# Patient Record
Sex: Male | Born: 1965 | Race: White | Hispanic: No | Marital: Married | Smoking: Never smoker
Health system: Southern US, Community
[De-identification: ages and names within clinical notes are randomized; demographics above are authoritative.]

## PROBLEM LIST (undated history)

## (undated) DIAGNOSIS — K4020 Bilateral inguinal hernia, without obstruction or gangrene, not specified as recurrent: Secondary | ICD-10-CM

## (undated) DIAGNOSIS — M109 Gout, unspecified: Secondary | ICD-10-CM

## (undated) HISTORY — DX: Bilateral inguinal hernia, without obstruction or gangrene, not specified as recurrent: K40.20

## (undated) HISTORY — DX: Gout, unspecified: M10.9

---

## 1995-01-26 HISTORY — PX: INGUINAL HERNIA REPAIR: SUR1180

## 2001-01-25 HISTORY — PX: INGUINAL HERNIA REPAIR: SUR1180

## 2009-12-29 ENCOUNTER — Emergency Department: Payer: Self-pay | Admitting: Unknown Physician Specialty

## 2011-11-10 ENCOUNTER — Other Ambulatory Visit: Payer: Self-pay | Admitting: Family Medicine

## 2011-11-10 NOTE — Telephone Encounter (Signed)
Patients chart is at the nurses station in the pa pool pile.  [facility] [id number]

## 2011-11-23 IMAGING — US US SOFT TISSUE EXCLUDE HEAD/NECK
1 series · 12 of 12 positions shown · non-contrast
Comparison: none

REASON FOR EXAM: pain right groin right thigh
COMMENTS:

[Series 1: us soft tissue exclude head/neck · 12 of 12 slices shown]
[im 1/12]
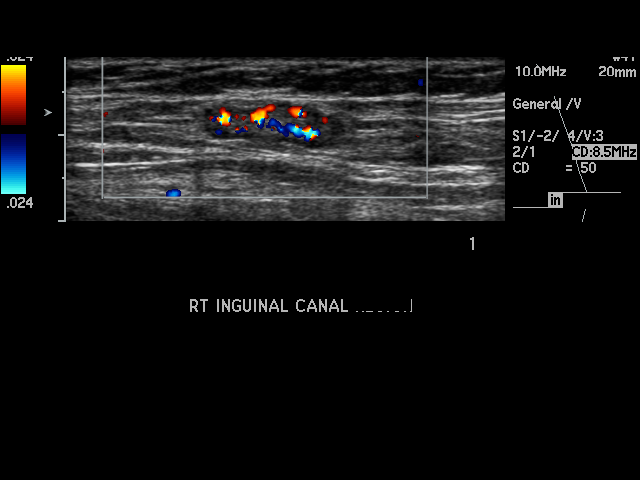
[im 2/12]
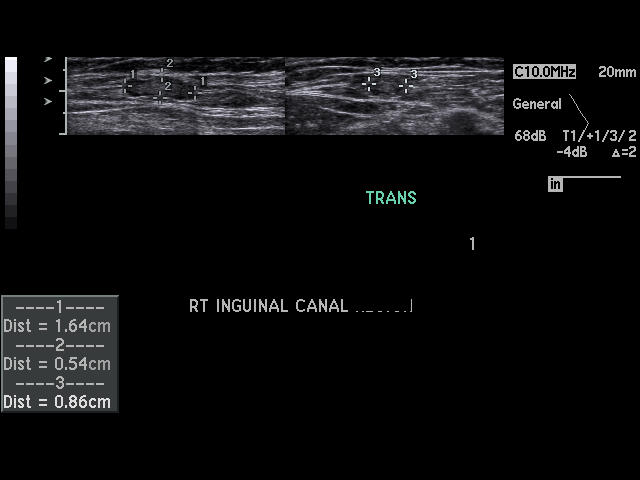
[im 3/12]
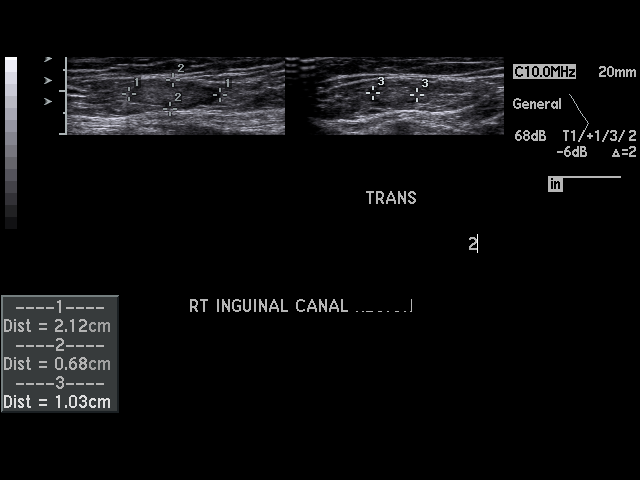
[im 4/12]
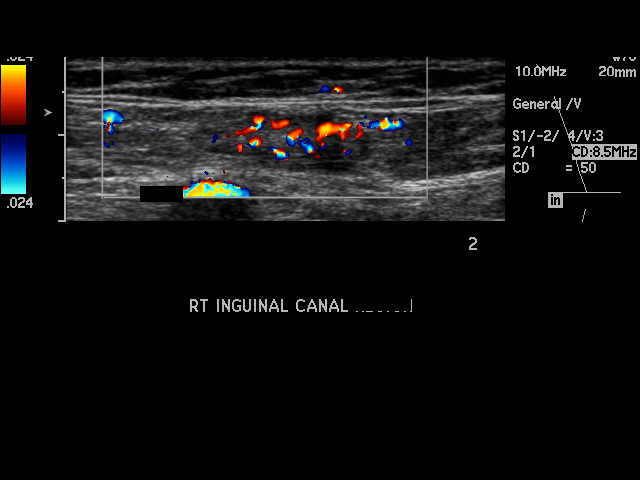
[im 5/12]
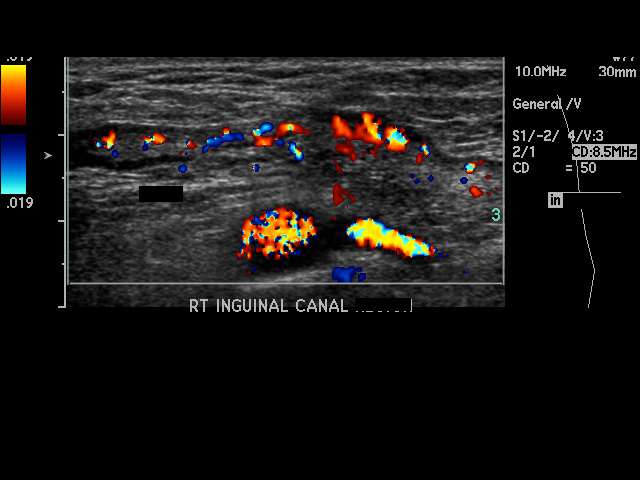
[im 6/12]
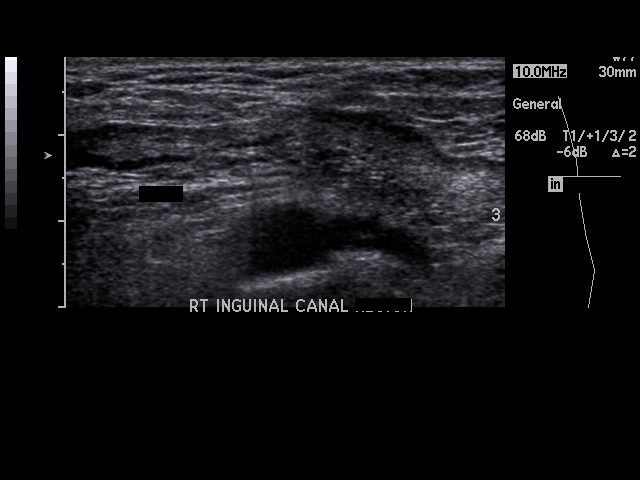
[im 7/12]
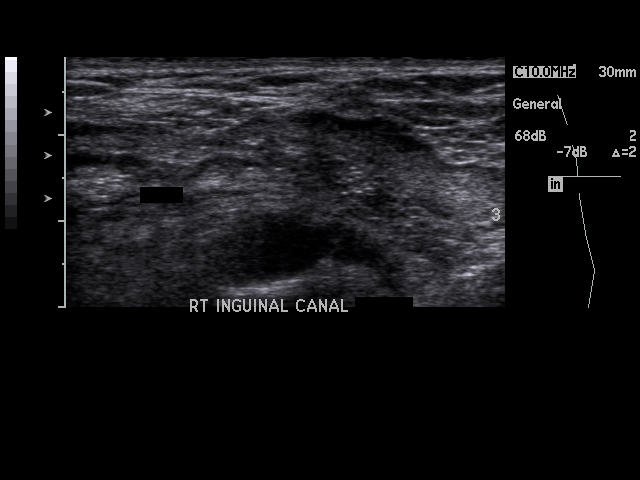
[im 8/12]
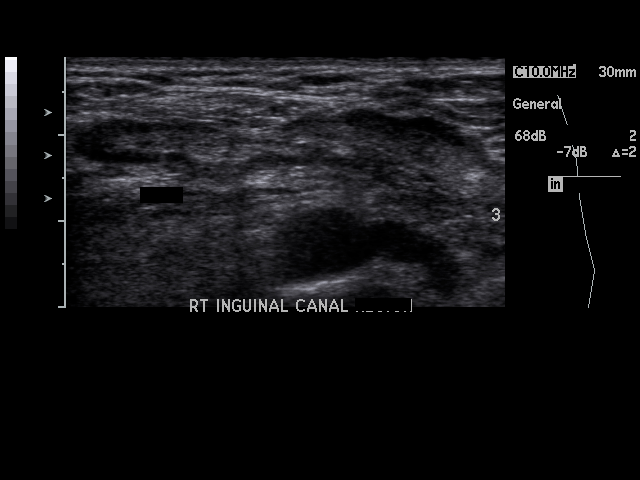
[im 9/12]
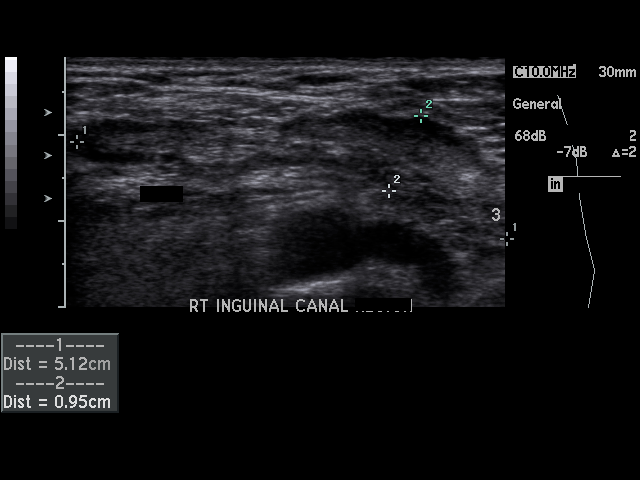
[im 10/12]
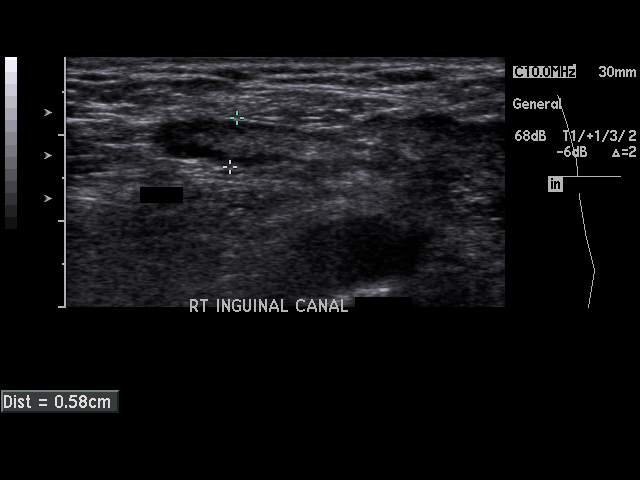
[im 11/12]
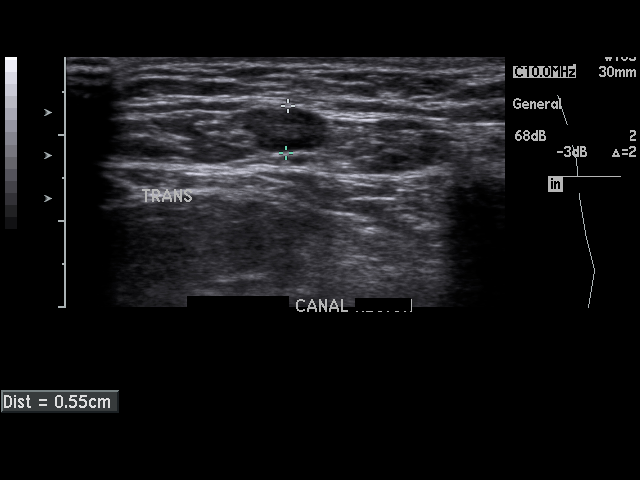
[im 12/12]
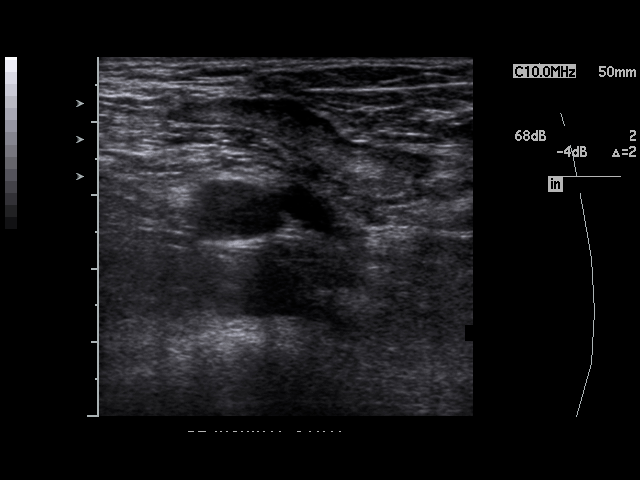

[12 of 12 positions shown; findings below may reference images not displayed]

PROCEDURE:     US  - US SOFT TISSUE, NOT NECK /  HEAD  - December 29, 2009  [DATE]

RESULT:     Targeted ultrasound in the right groin region demonstrates an
area of soft tissue echotexture measuring 5.12 x 0.95 x 0.58 cm in the
region of the inguinal canal. This does not have a fatty appearance
suggestive of a lymph node in the hilar region. The structure appears to be
one of 3 similar appearing areas with the other 2 being less in size with
the first measuring up to 1.64 cm in length and the second measuring up to
2.12 cm in length. The largest is nonspecific. The possibility of a loop of
bowel in the inguinal canal is not completely excluded but no peristalsis
was evident. Some adjacent enlarged lymph node could give this appearance.
No separation is evident. The patient did not seem tender to compression of
the area.
IMPRESSION: Findings suggestive of some possible enlarged lymph nodes
in the right groin region. The possibility of a hernia in the inguinal canal
is not excluded. CT correlation is available if desired. This could be
performed with a CT of the pelvis. Oral contrast would be beneficial if
there is concern for a right inguinal hernia.

## 2017-02-02 ENCOUNTER — Ambulatory Visit: Payer: Self-pay | Admitting: Surgery

## 2017-02-02 ENCOUNTER — Encounter: Payer: Self-pay | Admitting: Surgery

## 2017-02-02 VITALS — BP 170/84 | HR 97 | Temp 98.1°F | Ht 71.0 in | Wt 184.0 lb

## 2017-02-02 DIAGNOSIS — N50819 Testicular pain, unspecified: Secondary | ICD-10-CM

## 2017-02-02 NOTE — Patient Instructions (Signed)
For pain, please take Ibuprofen 600 MG every 8 hours, as need.  Please go to a [facility] location and let them collect your urine. We will call you with results.  [facility] and have your ultrasound done. We will call you back with your results.

## 2017-02-02 NOTE — Progress Notes (Signed)
02/02/2017  Reason for Visit:  Left testicular and groin pain  History of Present Illness: Austin Craig is a 52 y.o. male with a history of bilateral open inguinal hernia repairs in the past who presents with a 2-week history of worsening left testicular and groin pain.  He reports that first 2 weeks ago, he noticed that his left testicle had elevated and with that came some discomfort and pain as well as some scrotal swelling.  This has worsened over the past week and is now also associated with some left groin pain and burning.  He reports that he has some burning and pain sensation with urination as well as with bowel movement as he strains a little bit for them.  Denies any blood in his stool but does report seeing some blood in his underwear that he believes came from his penis, however he denies any hematuria.  He does have unprotected sex but only with 1 partner who is his wife.  He does not have a PCP and has not seen a urologist either.  Past Medical History: Past Medical History:  Diagnosis Date  . Inguinal hernia bilateral, non-recurrent      Past Surgical History: Past Surgical History:  Procedure Laterality Date  . INGUINAL HERNIA REPAIR Right 1997  . INGUINAL HERNIA REPAIR Left 2003    Home Medications: Prior to Admission medications   None    Allergies: No Known Allergies  Social History:  reports that  has never smoked. he has never used smokeless tobacco. He reports that he drinks about 2.4 - 3.6 oz of alcohol per week. He reports that he does not use drugs.   Family History: No family history on file.  Review of Systems: Review of Systems  Constitutional: Negative for chills and fever.  Respiratory: Negative for shortness of breath.   Cardiovascular: Negative for chest pain.  Gastrointestinal: Negative for abdominal pain, blood in stool, constipation, diarrhea, nausea and vomiting.  Genitourinary: Positive for dysuria. Negative for hematuria.  Musculoskeletal:  Positive for myalgias (left groin pain).  Skin: Negative for rash.  Neurological: Negative for dizziness.  Psychiatric/Behavioral: Negative for depression.  All other systems reviewed and are negative.   Physical Exam BP (!) 170/84   Pulse 97   Temp 98.1 F (36.7 C) (Oral)   Ht 5\' 11"  (1.803 m)   Wt 83.5 kg (184 lb)   BMI 25.66 kg/m  CONSTITUTIONAL: No acute distress RESPIRATORY:  Lungs are clear, and breath sounds are equal bilaterally. Normal respiratory effort without pathologic use of accessory muscles. CARDIOVASCULAR: Heart is regular without murmurs, gallops, or rubs. GI: The abdomen is soft, nondistended, with mild tenderness to palpation over the left groin area.  There is mild swelling over the left groin but with no induration.  No evidence of a recurrent hernia on that side. GU: No penile drainage.  Left testicle is raised compared to the right with some tenderness to palpation of both the lower portion upper portion of the testicle.  There is very mild induration of the scrotum.  No significant erythema. NEUROLOGIC:  Motor and sensation is grossly normal.  Cranial nerves are grossly intact. PSYCH:  Alert and oriented to person, place and time. Affect is normal.  Laboratory Analysis: No results found for this or any previous visit (from the past 24 hour(s)).  Imaging: No results found.  Assessment and Plan: This is a 51 y.o. male who presents with left testicular and left groin pain.  Discussed with the patient  at this point I do not have a clear evidence of recurrence of his left inguinal hernia.  There is more concerned given that he has dysuria with pain with urination and burning sensation.  Given this with his testicular discomfort, there could be epididymitis versus urinary tract infection.  At this point will start with diagnostic testing including urinalysis, left testicular and groin ultrasounds.  Based on these results, patient may need antibiotic therapy versus  referral to urology.  The patient understands this plan and all of his questions have been answered.  Face-to-face time spent with the patient and care providers was 30 minutes, with more than 50% of the time spent counseling, educating, and coordinating care of the patient.     [name], MD [facility]

## 2017-02-03 ENCOUNTER — Other Ambulatory Visit: Payer: Self-pay | Admitting: Surgery

## 2017-02-04 ENCOUNTER — Ambulatory Visit
Admission: RE | Admit: 2017-02-04 | Discharge: 2017-02-04 | Disposition: A | Discharge status: Home / Self Care | Payer: Self-pay | Source: Ambulatory Visit | Attending: Surgery | Admitting: Surgery

## 2017-02-04 DIAGNOSIS — N503 Cyst of epididymis: Secondary | ICD-10-CM | POA: Insufficient documentation

## 2017-02-04 DIAGNOSIS — N50819 Testicular pain, unspecified: Secondary | ICD-10-CM | POA: Insufficient documentation

## 2017-02-05 LAB — URINALYSIS, ROUTINE W REFLEX MICROSCOPIC
Bilirubin, UA: NEGATIVE
Ketones, UA: NEGATIVE
LEUKOCYTES UA: NEGATIVE
Nitrite, UA: NEGATIVE
PH UA: 6.5 (ref 5.0–7.5)
PROTEIN UA: NEGATIVE
RBC, UA: NEGATIVE
SPEC GRAV UA: 1.01 (ref 1.005–1.030)
Urobilinogen, Ur: 0.2 mg/dL (ref 0.2–1.0)

## 2017-02-05 LAB — URINE CULTURE: Organism ID, Bacteria: NO GROWTH

## 2017-02-07 ENCOUNTER — Telehealth: Payer: Self-pay

## 2017-02-07 NOTE — Telephone Encounter (Signed)
Patient called me back and I told him that since his U/S and urine tests were normal, Dr. Michaele OfferPiscary wanted him to see the Urologist. Patient understood and had no further questions. I told patient that if he didn't hear from them within a week, to please call us to check on the referral.

## 2017-02-07 NOTE — Telephone Encounter (Signed)
Patient's pathology results received. Pathology scanned into patients chart and can be found under media tab.

## 2017-02-07 NOTE — Telephone Encounter (Signed)
-----   Message from AvenalMaritza Rasheena Talmadge, [place] sent at 02/02/2017  4:53 PM EST ----- Regarding: U/S Look for U/S results and urine culture

## 2017-02-07 NOTE — Telephone Encounter (Signed)
Called patient and left him a detailed message letting him know that his U/S and labs were normal. Therefore, we would refer him to Urology so they could help him with his testicle pain.

## 2017-02-22 ENCOUNTER — Ambulatory Visit (INDEPENDENT_AMBULATORY_CARE_PROVIDER_SITE_OTHER): Payer: Self-pay | Admitting: Urology

## 2017-02-22 ENCOUNTER — Encounter: Payer: Self-pay | Admitting: Urology

## 2017-02-22 VITALS — BP 151/92 | HR 94 | Ht 71.0 in | Wt 185.4 lb

## 2017-02-22 DIAGNOSIS — R300 Dysuria: Secondary | ICD-10-CM

## 2017-02-22 DIAGNOSIS — N451 Epididymitis: Secondary | ICD-10-CM

## 2017-02-22 LAB — URINALYSIS, COMPLETE
Bilirubin, UA: NEGATIVE
KETONES UA: NEGATIVE
Leukocytes, UA: NEGATIVE
NITRITE UA: NEGATIVE
PROTEIN UA: NEGATIVE
RBC, UA: NEGATIVE
SPEC GRAV UA: 1.015 (ref 1.005–1.030)
UUROB: 0.2 mg/dL (ref 0.2–1.0)
pH, UA: 6.5 (ref 5.0–7.5)

## 2017-02-22 LAB — MICROSCOPIC EXAMINATION

## 2017-02-22 NOTE — Progress Notes (Signed)
02/22/2017 12:03 PM   Austin Craig 1965-04-26 [id number]  Referring provider: Henrene Dodge, MD [address] [place], [place] [address]  Chief Complaint  Patient presents with  . Epididymitis    HPI: 52 yo M referred for further evalation of testicular pain.    He reports he developed a "pull" in the perinium after a long walk which resolved spontaneously about one month ago.  He then developed testicular pain on the left groin radiating to the left testicle.  He describes the pain is burning in nature.  This was but is slowly improving.  It is exacerbated with activity or straining.Marland Kitchen  He underwent further evaluation in the form of a scrotal ultrasound on 02/05/2017 ordered by his primary care physician.  This showed good flow to both testicles, very small 3 mm epididymal cyst on the right, otherwise no pathology.  He denies any urinary symptoms.  He occasionally has some intermittent dysuria when straining, otherwise no urgency, frequency, hematuria, or any other changes from his baseline urinary symptoms.  He does report seeing a small drop of possibly blood in his underwear which he believes may have come from his penis without any associated gross hematuria.  No other penile discharge.  No STI exposures, sexually active with his wife only.  He does have a history of bilateral inguinal hernia repair.  He believes his right was performed to the open approach in [id number] and his left was performed via laparoscopic approach in 2003.     PMH: Past Medical History:  Diagnosis Date  . Gout   . Inguinal hernia bilateral, non-recurrent     Surgical History: Past Surgical History:  Procedure Laterality Date  . INGUINAL HERNIA REPAIR Right 1997  . INGUINAL HERNIA REPAIR Left 2003    Home Medications:  Allergies as of 02/22/2017   No Known Allergies     Medication List    as of 02/22/2017 11:59 PM   You have not been prescribed any medications.     Allergies: No  Known Allergies  Family History: Family History  Problem Relation Age of Onset  . Heart attack Mother   . Alzheimer's disease Father     Social History:  reports that  has never smoked. he has never used smokeless tobacco. He reports that he drinks about 2.4 - 3.6 oz of alcohol per week. He reports that he does not use drugs.  ROS: UROLOGY Frequent Urination?: No Hard to postpone urination?: No Burning/pain with urination?: Yes Get up at night to urinate?: Yes Leakage of urine?: No Urine stream starts and stops?: No Trouble starting stream?: No Do you have to strain to urinate?: No Blood in urine?: No Urinary tract infection?: No Sexually transmitted disease?: No Injury to kidneys or bladder?: No Painful intercourse?: No Weak stream?: No Erection problems?: Yes Penile pain?: No  Gastrointestinal Nausea?: No Vomiting?: No Indigestion/heartburn?: No Diarrhea?: No Constipation?: No  Constitutional Fever: No Night sweats?: No Weight loss?: No Fatigue?: No  Skin Skin rash/lesions?: No Itching?: No  Eyes Blurred vision?: No Double vision?: No  Ears/Nose/Throat Sore throat?: No Sinus problems?: No  Hematologic/Lymphatic Swollen glands?: No Easy bruising?: No  Cardiovascular Leg swelling?: No Chest pain?: No  Respiratory Cough?: No Shortness of breath?: No  Endocrine Excessive thirst?: No  Musculoskeletal Back pain?: No Joint pain?: No  Neurological Headaches?: No Dizziness?: No  Psychologic Depression?: No Anxiety?: No  Physical Exam: BP (!) 151/92 (BP Location: Left Arm, Patient Position: Sitting, Cuff Size: Normal)  Pulse 94   Ht 5\' 11"  (1.803 m)   Wt 185 lb 6.4 oz (84.1 kg)   BMI 25.86 kg/m   Constitutional:  Alert and oriented, No acute distress. HEENT: [place] AT, moist mucus membranes.  Trachea midline, no masses. Cardiovascular: No clubbing, cyanosis, or edema. Respiratory: Normal respiratory effort, no increased work of  breathing. GI: Abdomen is soft, nontender, nondistended, no abdominal masses.  Careful inguinal exam reveals previous scar in the right lower quadrant.  No inguinal scars appreciated on the left.  Umbilical scar appreciated.  No inguinal hernias palpable bilaterally when examined both in supine and standing positions or with Valsalva. GU: Normal phallus with orthotopic meatus, no penile discharge appreciated.  Bilateral descended testicles, nontender, no masses, normal epididymis bilaterally without tenderness.  Normal cords bilaterally. Skin: No rashes, bruises or suspicious lesions. Lymph: No inguinal adenopathy. Neurologic: Grossly intact, no focal deficits, moving all 4 extremities. Psychiatric: Normal mood and affect.  Laboratory Data: n/a  Urinalysis Results for orders placed or performed in visit on 02/22/17  GC/Chlamydia Probe Amp([facility])  Result Value Ref Range   Chlamydia trachomatis, NAA Negative Negative   Neisseria gonorrhoeae by PCR Negative Negative  Microscopic Examination  Result Value Ref Range   RBC, UA 0-2 0 - 2 /hpf   Epithelial Cells (non renal) CANCELED    Bacteria, UA Few (A) None seen/Few  Urinalysis, Complete  Result Value Ref Range   Specific Gravity, UA 1.015 1.005 - 1.030   pH, UA 6.5 5.0 - 7.5   Color, UA Yellow Yellow   Appearance Ur Clear Clear   Leukocytes, UA Negative Negative   Protein, UA Negative Negative/Trace   Glucose, UA 2+ (A) Negative   Ketones, UA Negative Negative   RBC, UA Negative Negative   Bilirubin, UA Negative Negative   Urobilinogen, Ur 0.2 0.2 - 1.0 mg/dL   Nitrite, UA Negative Negative   Microscopic Examination See below:     Pertinent Imaging: CLINICAL DATA:  Testicular pain for 1 month, worsening over the last week.  EXAM: SCROTAL ULTRASOUND  DOPPLER ULTRASOUND OF THE TESTICLES  TECHNIQUE: Complete ultrasound examination of the testicles, epididymis, and other scrotal structures was performed. Color and  spectral Doppler ultrasound were also utilized to evaluate blood flow to the testicles.  COMPARISON:  None.  FINDINGS: Right testicle  Measurements: 4.6 x 2.1 x 3 cm. No mass or microlithiasis visualized.  Left testicle  Measurements: 4.8 x 2.2 x 3.3 cm. No mass or microlithiasis visualized.  Right epididymis:  There is a 2.9 mm epididymal cyst on the right.  Left epididymis:  Normal in size and appearance.  Hydrocele:  None visualized.  Varicocele:  None visualized.  Pulsed Doppler interrogation of both testes demonstrates normal low resistance arterial and venous waveforms bilaterally.  IMPRESSION: 1. No cause for testicular pain identified. 2. Tiny right epididymal cyst. 3. No other abnormalities.   Electronically Signed   By: Gerome Samavid  Williams III M.D   On: 02/05/2017 07:03  Assessment & Plan:    1. Left epididymitis Improving-reassured Normal GU exam Suspect possible left chemical epididymitis Recommend supportive care with jockstrap, NSAIDs, and ice as needed Return to care if symptoms fail to resolve  2. Dysuria UA without evidence of infection Recommend GC chlamydia to rule out any underlying STI although unlikely Dysuria only associated with straining, - Urinalysis, Complete - GC/Chlamydia Probe Amp([facility])   Return if symptoms worsen or fail to improve.  Vanna ScotlandAshley Jeryn Cerney, MD  [facility] [address]  [address], [place], [place] [address] [phone]

## 2017-02-22 NOTE — Patient Instructions (Signed)
Epididymitis Epididymitis is swelling (inflammation) of the epididymis. The epididymis is a cord-like structure that is located along the top and back part of the testicle. It collects and stores sperm from the testicle. This condition can also cause pain and swelling of the testicle and scrotum. Symptoms usually start suddenly (acute epididymitis). Sometimes epididymitis starts gradually and lasts for a while (chronic epididymitis). This type may be harder to treat. What are the causes? In men 35 and younger, this condition is usually caused by a bacterial infection or sexually transmitted disease (STD), such as:  Gonorrhea.  Chlamydia.  In men 35 and older who do not have anal sex, this condition is usually caused by bacteria from a blockage or abnormalities in the urinary system. These can result from:  Having a tube placed into the bladder (urinary catheter).  Having an enlarged or inflamed prostate gland.  Having recent urinary tract surgery.  In men who have a condition that weakens the body's defense system (immune system), such as HIV, this condition can be caused by:  Other bacteria, including tuberculosis and syphilis.  Viruses.  Fungi.  Sometimes this condition occurs without infection. That may happen if urine flows backward into the epididymis after heavy lifting or straining. What increases the risk? This condition is more likely to develop in men:  Who have unprotected sex with more than one partner.  Who have anal sex.  Who have recently had surgery.  Who have a urinary catheter.  Who have urinary problems.  Who have a suppressed immune system.  What are the signs or symptoms? This condition usually begins suddenly with chills, fever, and pain behind the scrotum and in the testicle. Other symptoms include:  Swelling of the scrotum, testicle, or both.  Pain whenejaculatingor urinating.  Pain in the back or belly.  Nausea.  Itching and discharge  from the penis.  Frequent need to pass urine.  Redness and tenderness of the scrotum.  How is this diagnosed? Your health care provider can diagnose this condition based on your symptoms and medical history. Your health care provider will also do a physical exam to ask about your symptoms and check your scrotum and testicle for swelling, pain, and redness. You may also have other tests, including:  Examination of discharge from the penis.  Urine tests for infections, such as STDs.  Your health care provider may test you for other STDs, including HIV. How is this treated? Treatment for this condition depends on the cause. If your condition is caused by a bacterial infection, oral antibiotic medicine may be prescribed. If the bacterial infection has spread to your blood, you may need to receive IV antibiotics. Nonbacterial epididymitis is treated with home care that includes bed rest and elevation of the scrotum. Surgery may be needed to treat:  Bacterial epididymitis that causes pus to build up in the scrotum (abscess).  Chronic epididymitis that has not responded to other treatments.  Follow these instructions at home: Medicines  Take over-the-counter and prescription medicines only as told by your health care provider.  If you were prescribed an antibiotic medicine, take it as told by your health care provider. Do not stop taking the antibiotic even if your condition improves. Sexual Activity  If your epididymitis was caused by an STD, avoid sexual activity until your treatment is complete.  Inform your sexual partner or partners if you test positive for an STD. They may need to be treated.Do not engage in sexual activity with your partner or   partners until their treatment is completed. General instructions  Return to your normal activities as told by your health care provider. Ask your health care provider what activities are safe for you.  Keep your scrotum elevated and  supported while resting. Ask your health care provider if you should wear a scrotal support, such as a jockstrap. Wear it as told by your health care provider.  If directed, apply ice to the affected area: ? Put ice in a plastic bag. ? Place a towel between your skin and the bag. ? Leave the ice on for 20 minutes, 2-3 times per day.  Try taking a sitz bath to help with discomfort. This is a warm water bath that is taken while you are sitting down. The water should only come up to your hips and should cover your buttocks. Do this 3-4 times per day or as told by your health care provider.  Keep all follow-up visits as told by your health care provider. This is important. Contact a health care provider if:  You have a fever.  Your pain medicine is not helping.  Your pain is getting worse.  Your symptoms do not improve within three days. This information is not intended to replace advice given to you by your health care provider. Make sure you discuss any questions you have with your health care provider. Document Released: 01/09/2000 Document Revised: 06/19/2015 Document Reviewed: 05/29/2014 Elsevier Interactive Patient Education  2018 [employer]  

## 2017-02-24 ENCOUNTER — Telehealth: Payer: Self-pay

## 2017-02-24 LAB — GC/CHLAMYDIA PROBE AMP
Chlamydia trachomatis, NAA: NEGATIVE
NEISSERIA GONORRHOEAE BY PCR: NEGATIVE

## 2017-02-24 NOTE — Telephone Encounter (Signed)
-----   Message from Vanna ScotlandAshley Brandon, MD sent at 02/24/2017  8:48 AM EST ----- Negative results for STI.  Vanna ScotlandAshley Brandon, MD

## 2017-02-24 NOTE — Telephone Encounter (Signed)
Letter sent.

## 2018-09-16 IMAGING — US US SCROTUM W/ DOPPLER COMPLETE
1 series · 14 of 25 positions shown · non-contrast
Comparison: None.

CLINICAL DATA: Testicular pain for 1 month, worsening over the last
week.

EXAM:
SCROTAL ULTRASOUND
DOPPLER ULTRASOUND OF THE TESTICLES
TECHNIQUE: Complete ultrasound examination of the testicles, epididymis, and
other scrotal structures was performed. Color and spectral Doppler
ultrasound were also utilized to evaluate blood flow to the
testicles.

[Series 1: us scrotum w/ doppler complete · 0.08mm/px · 14 of 51 slices shown]
[im 1/51]
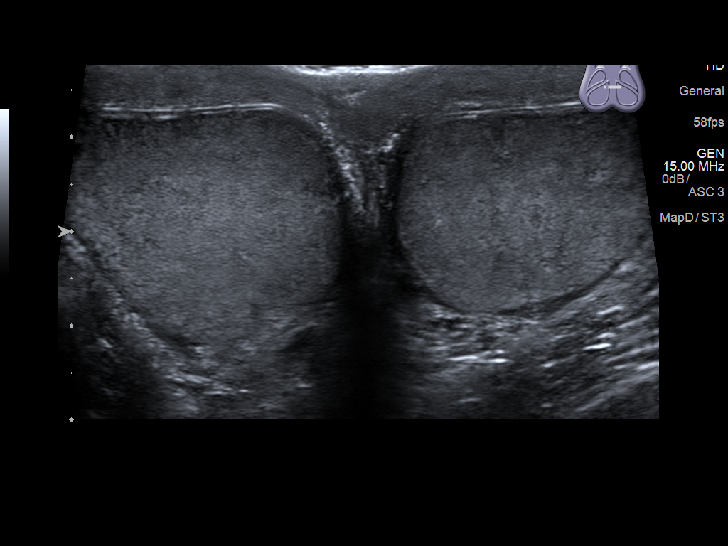
[im 5/51]
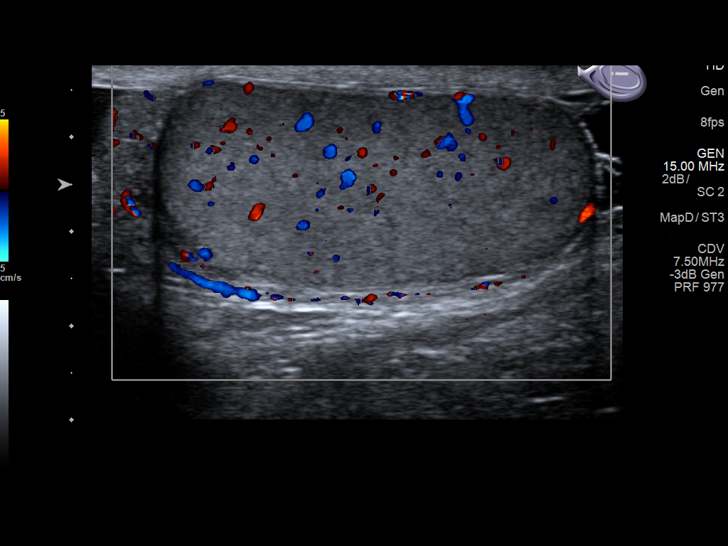
[im 9/51]
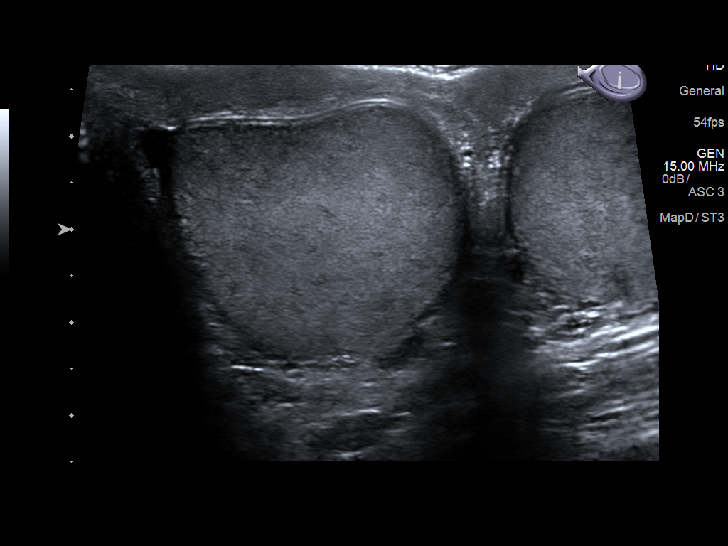
[im 13/51]
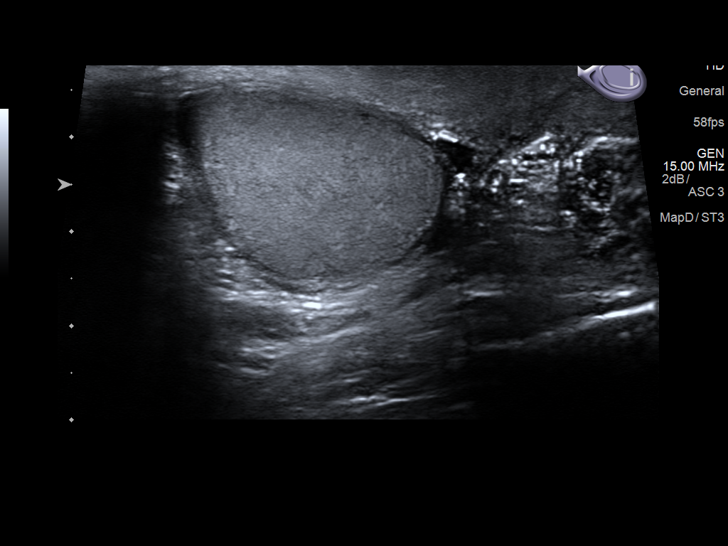
[im 17/51]
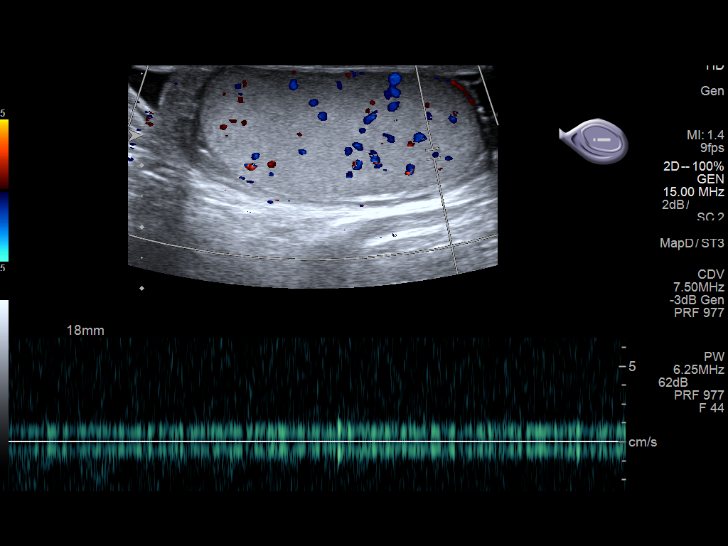
[im 19/51]
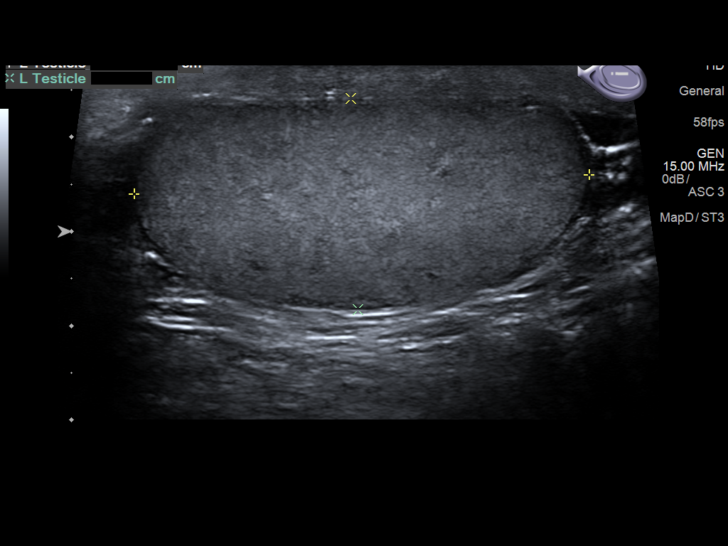
[im 23/51]
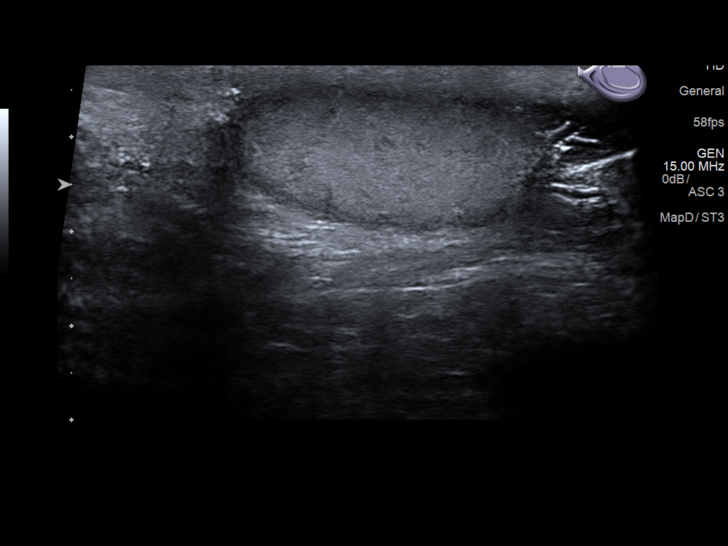
[im 28/51]
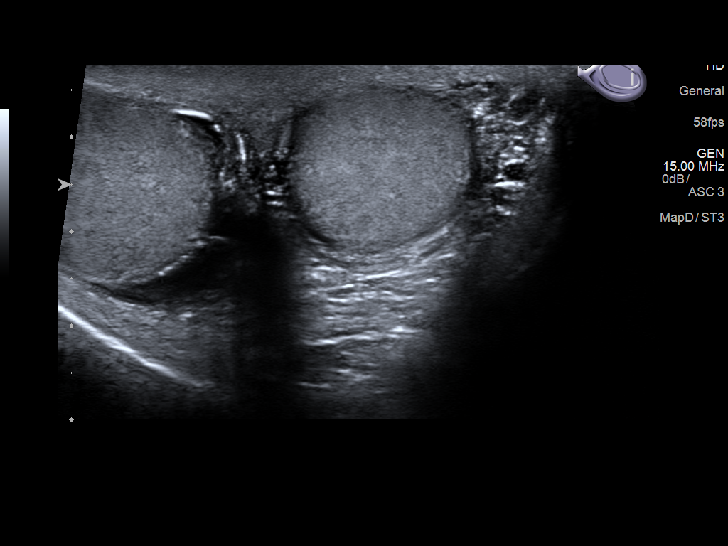
[im 32/51]
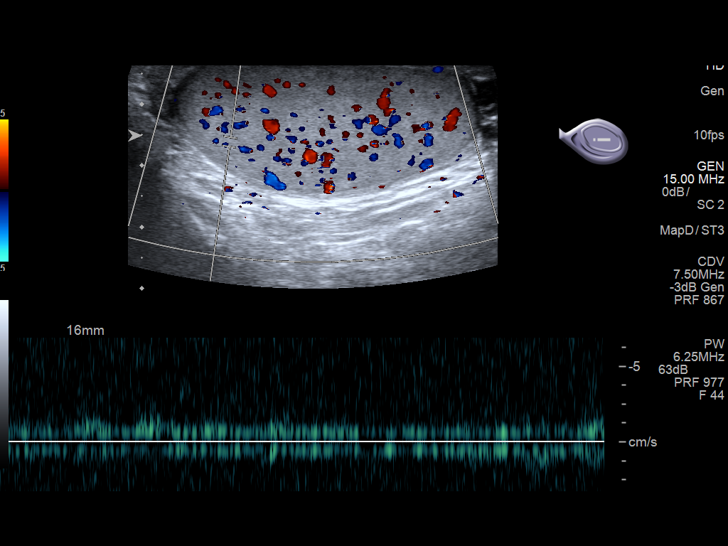
[im 34/51]
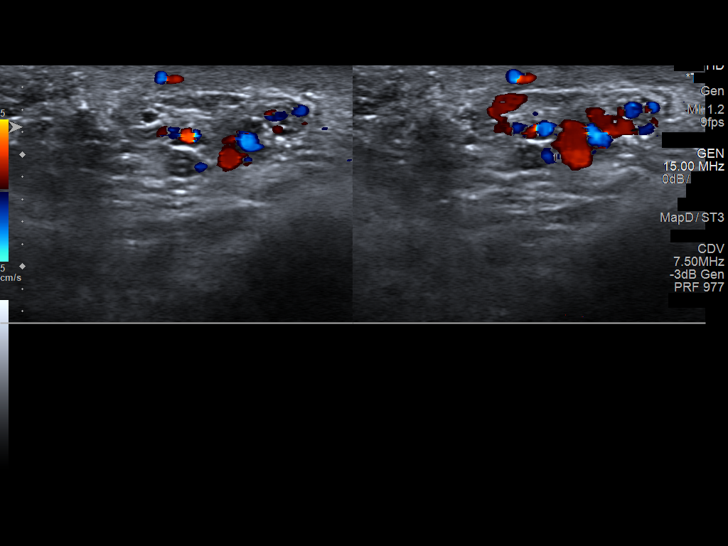
[im 38/51]
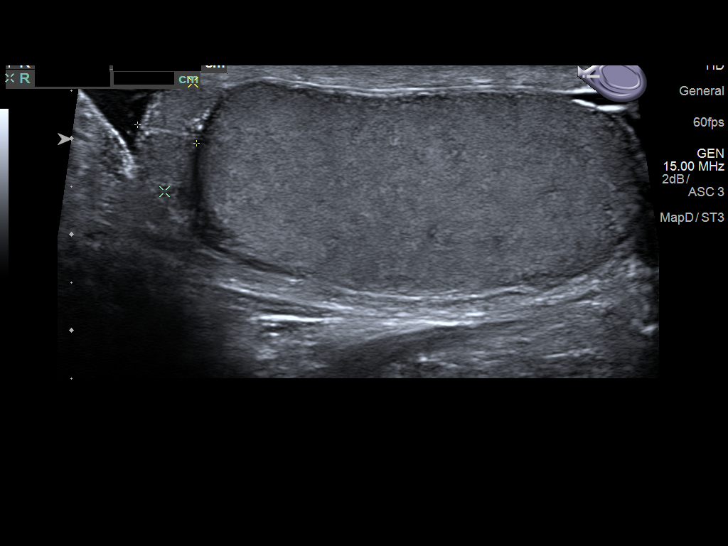
[im 42/51]
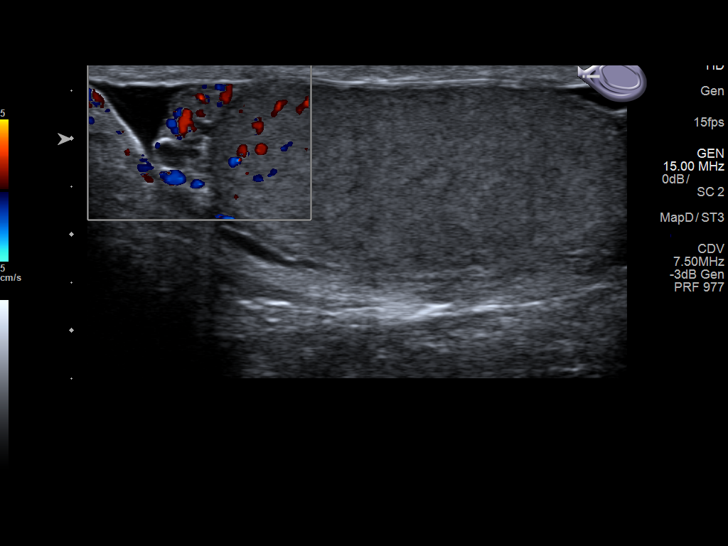
[im 46/51]
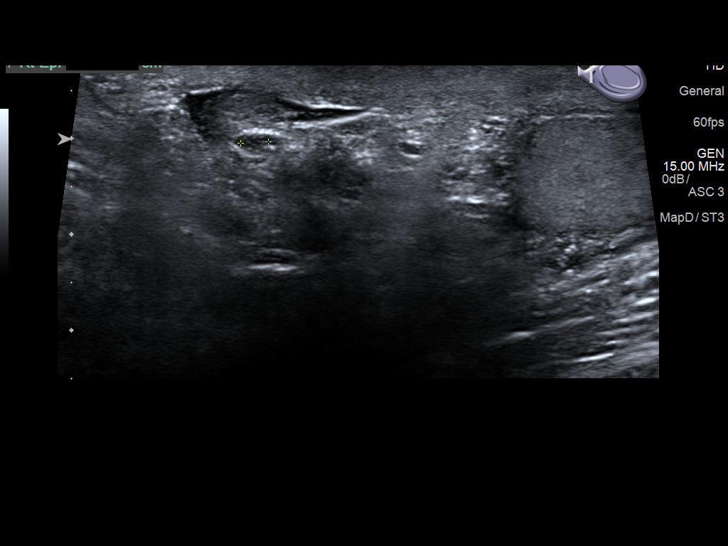
[im 51/51]
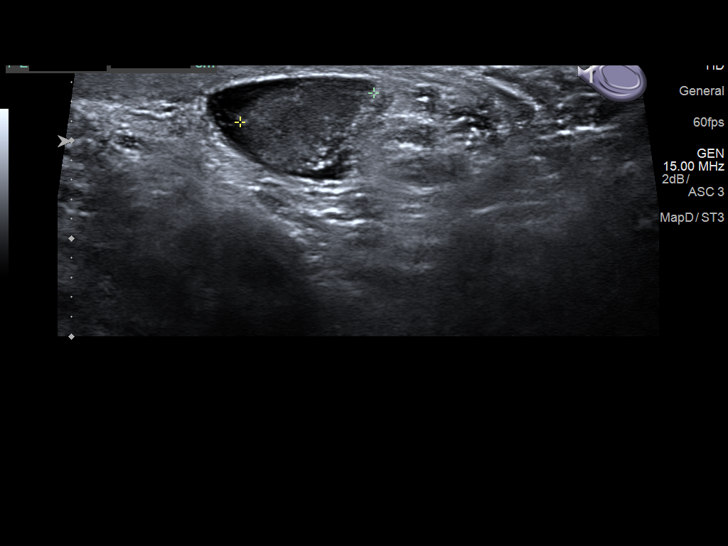

[14 of 25 positions shown; findings below may reference images not displayed]

FINDINGS: Right testicle

Measurements: 4.6 x 2.1 x 3 cm. No mass or microlithiasis
visualized.

Left testicle

Measurements: 4.8 x 2.2 x 3.3 cm. No mass or microlithiasis
visualized.

Right epididymis:  There is a 2.9 mm epididymal cyst on the right.

Left epididymis:  Normal in size and appearance.

Hydrocele:  None visualized.

Varicocele:  None visualized.

Pulsed Doppler interrogation of both testes demonstrates normal low
resistance arterial and venous waveforms bilaterally.
IMPRESSION: 1. No cause for testicular pain identified.
2. Tiny right epididymal cyst.
3. No other abnormalities.

## 2019-04-05 ENCOUNTER — Ambulatory Visit: Payer: Self-pay | Attending: Internal Medicine

## 2019-04-05 DIAGNOSIS — Z23 Encounter for immunization: Secondary | ICD-10-CM

## 2019-04-05 NOTE — Progress Notes (Signed)
   Covid-19 Vaccination Clinic  Name:  Austin Craig    MRN: [id number] DOB: 1965/06/13  04/05/2019  [name] was observed post Covid-19 immunization for 15 minutes without incident. He was provided with Vaccine Information Sheet and instruction to access the V-Safe system.   [name] was instructed to call 911 with any severe reactions post vaccine: Marland Kitchen Difficulty breathing  . Swelling of face and throat  . A fast heartbeat  . A bad rash all over body  . Dizziness and weakness   Immunizations Administered    Name Date Dose VIS Date Route   Pfizer COVID-19 Vaccine 04/05/2019 10:35 AM 0.3 mL 01/05/2019 Intramuscular   Manufacturer: [employer], [employer]   Lot: [id number]   NDC: [address]-0

## 2019-05-02 ENCOUNTER — Ambulatory Visit: Payer: Self-pay | Attending: Internal Medicine

## 2019-05-02 DIAGNOSIS — Z23 Encounter for immunization: Secondary | ICD-10-CM

## 2019-05-02 NOTE — Progress Notes (Signed)
   Covid-19 Vaccination Clinic  Name:  Lanorris Kalisz    MRN: [id number] DOB: October 14, 1965  05/02/2019  Mr. Egolf was observed post Covid-19 immunization for 15 minutes without incident. He was provided with Vaccine Information Sheet and instruction to access the V-Safe system.   Mr. Colson was instructed to call 911 with any severe reactions post vaccine: Marland Kitchen Difficulty breathing  . Swelling of face and throat  . A fast heartbeat  . A bad rash all over body  . Dizziness and weakness   Immunizations Administered    Name Date Dose VIS Date Route   Pfizer COVID-19 Vaccine 05/02/2019 10:20 AM 0.3 mL 01/05/2019 Intramuscular   Manufacturer: [employer], [employer]   Lot: [phone] [id number]   NDC: [address]-8

## 2019-12-15 LAB — EXTERNAL GENERIC LAB PROCEDURE

## 2022-01-01 LAB — EXTERNAL GENERIC LAB PROCEDURE
